# Patient Record
Sex: Male | Born: 2000 | Hispanic: No | Marital: Single
Health system: Southern US, Community
[De-identification: ages and names within clinical notes are randomized; demographics above are authoritative.]

---
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
## 2024-03-02 ENCOUNTER — Telehealth: Payer: Self-pay

## 2024-03-02 ENCOUNTER — Ambulatory Visit
Admission: EM | Admit: 2024-03-02 | Discharge: 2024-03-02 | Disposition: A | Discharge status: Home / Self Care | Attending: Internal Medicine | Admitting: Internal Medicine

## 2024-03-02 DIAGNOSIS — Z202 Contact with and (suspected) exposure to infections with a predominantly sexual mode of transmission: Secondary | ICD-10-CM | POA: Insufficient documentation

## 2024-03-02 DIAGNOSIS — Z113 Encounter for screening for infections with a predominantly sexual mode of transmission: Secondary | ICD-10-CM | POA: Diagnosis present

## 2024-03-02 MED ORDER — DOXYCYCLINE HYCLATE 100 MG PO CAPS
100.0000 mg | ORAL_CAPSULE | Freq: Two times a day (BID) | ORAL | 0 refills | Status: DC
  Filled 2024-03-02: qty 14, 7d supply, fill #0

## 2024-03-02 MED ORDER — DOXYCYCLINE HYCLATE 100 MG PO CAPS: 100.0000 mg | ORAL_CAPSULE | Freq: Two times a day (BID) | ORAL | 0 refills | Status: AC

## 2024-03-02 NOTE — Discharge Instructions (Addendum)
 Known exposure to chlamydia.  We will treat this with doxycycline 100 mg twice daily for 7 days.  This is an antibiotic.  Take this with food.  This antibiotic can make you more sensitive to the sun so avoid long exposure in the sun and make sure to wear sunscreen or long sleeves to avoid sunburn.  Screening swab and blood work done today and results will be available in 24-48 hours. We will contact you if we need to arrange additional treatment based on your testing. Negative results will be on your MyChart account. Abstain from sex until you receive your final results.  Use a condom for sexual encounters. If you have any worsening or changing symptoms including abnormal discharge, abdominal pain, fever, nausea, or vomiting, then you should be reevaluated.

## 2024-03-02 NOTE — Telephone Encounter (Signed)
 Pt requesting pharmacy change

## 2024-03-02 NOTE — ED Provider Notes (Signed)
 [facility]    CSN: [id number] Arrival date & time: 03/02/24  9175      History   Chief Complaint Chief Complaint  Patient presents with   Exposure to STD    HPI Jerry Nguyen is a 23 y.o. male.   23 year old male who presents urgent care requesting STI screening.  He reports that a week ago he had intercourse with his significant other.  He then found out that she had tested positive for chlamydia.  He would like to have a screening examination done today including swab and blood work.  He denies any penile discharge, penile pain, dysuria, hematuria, testicular pain.   Exposure to STD Pertinent negatives include no chest pain, no abdominal pain and no shortness of breath.    History reviewed. No pertinent past medical history.  Patient Active Problem List   Diagnosis Date Noted   Displaced fracture of distal end of right radius 06/01/2015    History reviewed. No pertinent surgical history.     Home Medications    Prior to Admission medications   Medication Sig Start Date End Date Taking? Authorizing Provider  doxycycline (VIBRAMYCIN) 100 MG capsule Take 1 capsule (100 mg total) by mouth 2 (two) times Nguyen for 7 days. 03/02/24 03/09/24 Yes Cleo Santucci A, PA-C  clotrimazole  (LOTRIMIN ) 1 % cream Apply to affected area 2 times Nguyen 11/01/17   Jarold Olam HERO, PA-C  HYDROcodone -acetaminophen  (NORCO/VICODIN) 5-325 MG tablet Take 1 tablet by mouth every 4 (four) hours as needed for moderate pain or severe pain. 06/01/15   Edman Marsa PARAS, DO    Family History History reviewed. No pertinent family history.  Social History Social History   Tobacco Use   Smoking status: Passive Smoke Exposure - Never Smoker   Smokeless tobacco: Never  Substance Use Topics   Alcohol use: Not Currently   Drug use: Not Currently    Types: Cocaine    Comment: Former     Allergies   Mango flavoring agent (non-screening)   Review of Systems Review of  Systems  Constitutional:  Negative for chills and fever.  HENT:  Negative for ear pain and sore throat.   Eyes:  Negative for pain and visual disturbance.  Respiratory:  Negative for cough and shortness of breath.   Cardiovascular:  Negative for chest pain and palpitations.  Gastrointestinal:  Negative for abdominal pain and vomiting.  Genitourinary:  Negative for dysuria and hematuria.  Musculoskeletal:  Negative for arthralgias and back pain.  Skin:  Negative for color change and rash.  Neurological:  Negative for seizures and syncope.  All other systems reviewed and are negative.    Physical Exam Triage Vital Signs ED Triage Vitals  Encounter Vitals Group     BP 03/02/24 0843 131/83     Girls Systolic BP Percentile --      Girls Diastolic BP Percentile --      Boys Systolic BP Percentile --      Boys Diastolic BP Percentile --      Pulse Rate 03/02/24 0843 84     Resp 03/02/24 0843 18     Temp --      Temp src --      SpO2 03/02/24 0843 98 %     Weight --      Height --      Head Circumference --      Peak Flow --      Pain Score 03/02/24 0841 0  Pain Loc --      Pain Education --      Exclude from Growth Chart --    No data found.  Updated Vital Signs BP 131/83   Pulse 84   Resp 18   SpO2 98%   Visual Acuity Right Eye Distance:   Left Eye Distance:   Bilateral Distance:    Right Eye Near:   Left Eye Near:    Bilateral Near:     Physical Exam Vitals and nursing note reviewed.  Constitutional:      General: He is not in acute distress.    Appearance: He is well-developed.  HENT:     Head: Normocephalic and atraumatic.  Eyes:     Conjunctiva/sclera: Conjunctivae normal.  Cardiovascular:     Rate and Rhythm: Normal rate and regular rhythm.     Heart sounds: No murmur heard. Pulmonary:     Effort: Pulmonary effort is normal. No respiratory distress.     Breath sounds: Normal breath sounds.  Abdominal:     Palpations: Abdomen is soft.      Tenderness: There is no abdominal tenderness.  Musculoskeletal:        General: No swelling.     Cervical back: Neck supple.  Skin:    General: Skin is warm and dry.     Capillary Refill: Capillary refill takes less than 2 seconds.  Neurological:     Mental Status: He is alert.  Psychiatric:        Mood and Affect: Mood normal.      UC Treatments / Results  Labs (all labs ordered are listed, but only abnormal results are displayed) Labs Reviewed  RPR  HIV ANTIBODY (ROUTINE TESTING W REFLEX)  CYTOLOGY, (ORAL, ANAL, URETHRAL) ANCILLARY ONLY    EKG   Radiology No results found.  Procedures Procedures (including critical care time)  Medications Ordered in UC Medications - No data to display  Initial Impression / Assessment and Plan / UC Course  I have reviewed the triage vital signs and the nursing notes.  Pertinent labs & imaging results that were available during my care of the patient were reviewed by me and considered in my medical decision making (see chart for details).     Screening examination for STI - Plan: RPR, HIV Antibody (routine testing w rflx), RPR, HIV Antibody (routine testing w rflx)  Exposure to chlamydia   Known exposure to chlamydia.  We will treat this with doxycycline 100 mg twice Nguyen for 7 days.  This is an antibiotic.  Take this with food.  This antibiotic can make you more sensitive to the sun so avoid long exposure in the sun and make sure to wear sunscreen or long sleeves to avoid sunburn.  Screening swab and blood work done today and results will be available in 24-48 hours. We will contact you if we need to arrange additional treatment based on your testing. Negative results will be on your MyChart account. Abstain from sex until you receive your final results.  Use a condom for sexual encounters. If you have any worsening or changing symptoms including abnormal discharge, abdominal pain, fever, nausea, or vomiting, then you should be  reevaluated.    Final Clinical Impressions(s) / UC Diagnoses   Final diagnoses:  Screening examination for STI  Exposure to chlamydia     Discharge Instructions      Known exposure to chlamydia.  We will treat this with doxycycline 100 mg twice Nguyen for 7 days.  This is an antibiotic.  Take this with food.  This antibiotic can make you more sensitive to the sun so avoid long exposure in the sun and make sure to wear sunscreen or long sleeves to avoid sunburn.  Screening swab and blood work done today and results will be available in 24-48 hours. We will contact you if we need to arrange additional treatment based on your testing. Negative results will be on your MyChart account. Abstain from sex until you receive your final results.  Use a condom for sexual encounters. If you have any worsening or changing symptoms including abnormal discharge, abdominal pain, fever, nausea, or vomiting, then you should be reevaluated.      ED Prescriptions     Medication Sig Dispense Auth. Provider   doxycycline (VIBRAMYCIN) 100 MG capsule Take 1 capsule (100 mg total) by mouth 2 (two) times Nguyen for 7 days. 14 capsule Teresa Almarie LABOR, [place]      PDMP not reviewed this encounter.   Teresa Almarie LABOR, [place] 03/02/24 [phone]

## 2024-03-02 NOTE — ED Triage Notes (Signed)
 Pt present with exposure to Chlamydia. Pt states he does not have symptoms. States he only has one partner. Pt is requesting blood work.

## 2024-03-03 LAB — RPR: RPR Ser Ql: NONREACTIVE

## 2024-03-03 LAB — HIV ANTIBODY (ROUTINE TESTING W REFLEX): HIV Screen 4th Generation wRfx: NONREACTIVE

## 2024-03-04 ENCOUNTER — Other Ambulatory Visit (HOSPITAL_COMMUNITY): Payer: Self-pay

## 2024-03-04 LAB — CYTOLOGY, (ORAL, ANAL, URETHRAL) ANCILLARY ONLY
Chlamydia: NEGATIVE
Comment: NEGATIVE
Comment: NEGATIVE
Comment: NORMAL
Neisseria Gonorrhea: NEGATIVE
Trichomonas: NEGATIVE
